# Patient Record
Sex: Female | Born: 1935 | Race: White | Hispanic: No | Smoking: Former smoker
Health system: Southern US, Community
[De-identification: ages and names within clinical notes are randomized; demographics above are authoritative.]

## PROBLEM LIST (undated history)

## (undated) ENCOUNTER — Emergency Department (HOSPITAL_COMMUNITY): Admission: EM | Payer: Self-pay | Source: Home / Self Care

## (undated) DIAGNOSIS — E119 Type 2 diabetes mellitus without complications: Secondary | ICD-10-CM

## (undated) DIAGNOSIS — I10 Essential (primary) hypertension: Secondary | ICD-10-CM

---

## 2003-11-25 ENCOUNTER — Ambulatory Visit (HOSPITAL_COMMUNITY): Admission: RE | Admit: 2003-11-25 | Discharge: 2003-11-25 | Payer: Self-pay | Admitting: Family Medicine

## 2003-12-26 ENCOUNTER — Ambulatory Visit (HOSPITAL_COMMUNITY): Admission: RE | Admit: 2003-12-26 | Discharge: 2003-12-26 | Payer: Self-pay | Admitting: Ophthalmology

## 2005-03-27 IMAGING — CT CT HEAD WO/W CM
3 series · 18 of 30 positions shown, 20 images · IV contrast (omnipaque)
Comparison: none

CLINICAL DATA: Optic neuropathy.
 CT HEAD WITH AND WITHOUT CONTRAST
 Multidetector helical CT imaging head performed before and after 100 cc Omnipaque 300.  No prior studies for comparison. 
 Mild generalized atrophy.  Normal ventricular morphology.  No midline shift or mass effect.  No intracranial hemorrhage, mass, or infarct.  Visualized sinuses clear.  Calvarium unremarkable.  Orbits appear intact and symmetric.  No pathologic enhancement following contrast.  No abnormalities of the optic globes, nerves, or suprasellar regions seen. 
 IMPRESSION
 No acute intracranial abnormalities.

[Series 9479: — · axial · 0.49mm/px · z∈[-643,-533]mm · 8 of 58 slices shown, 10 images (1 of 3)]
[im 7/58  brain]
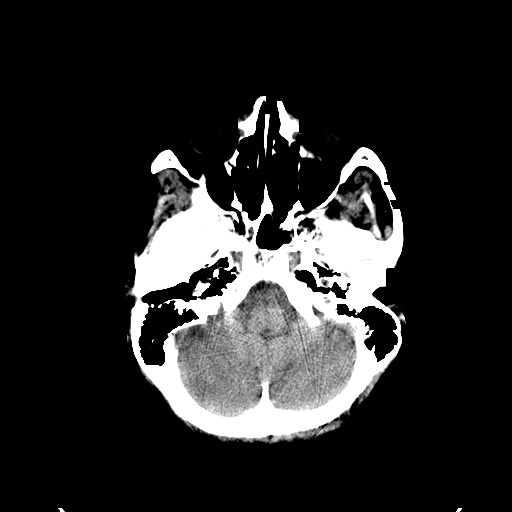
[im 7/58  bone]
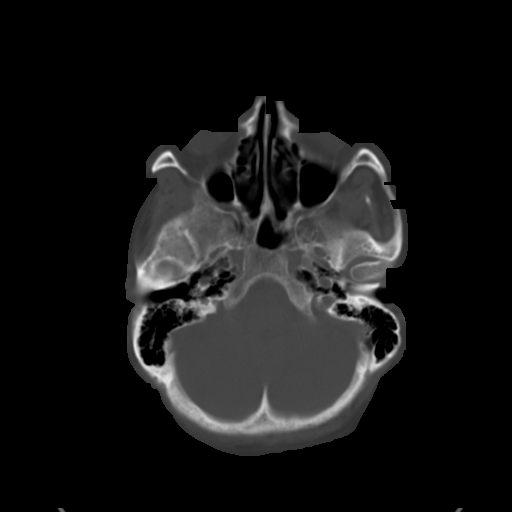
[im 13/58  brain]
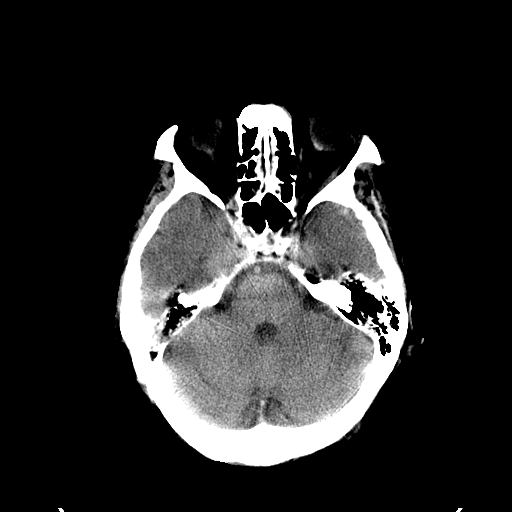
[im 20/58  brain]
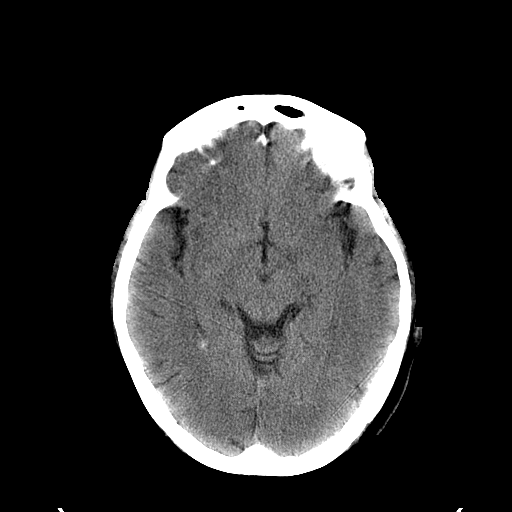
[im 26/58  brain]
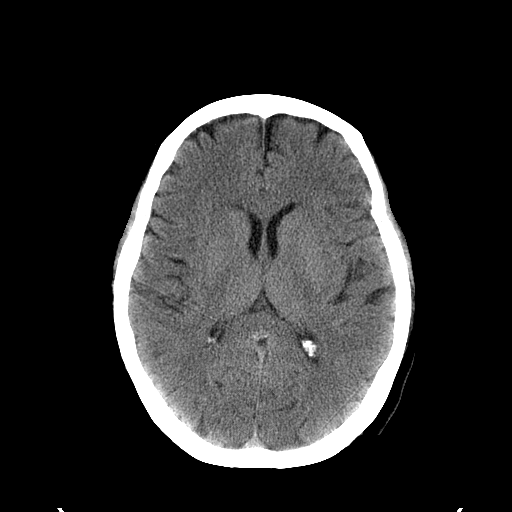
[im 32/58  brain]
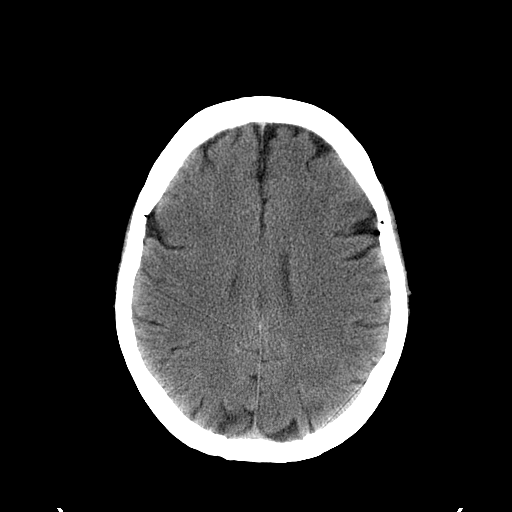
[im 32/58  bone]
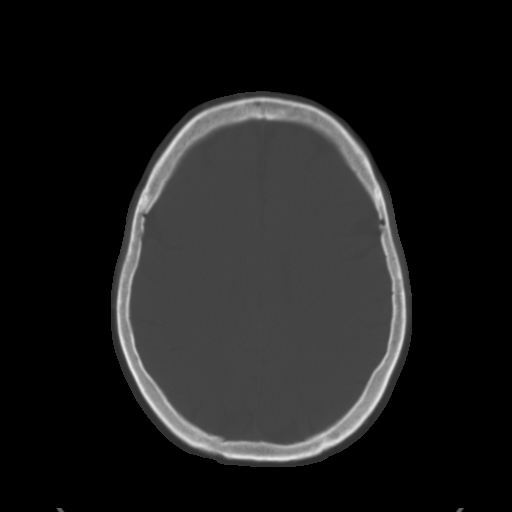
[im 39/58  brain]
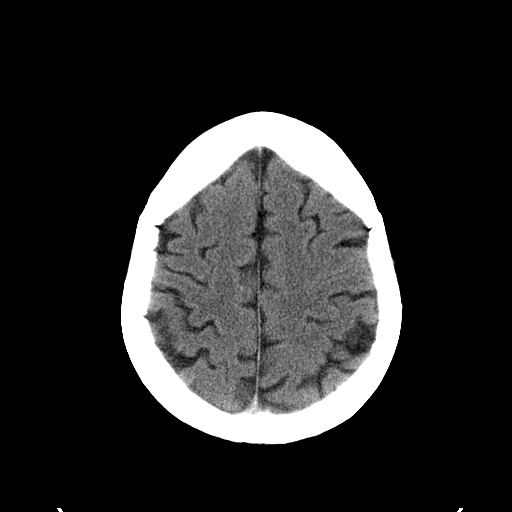
[im 45/58  brain]
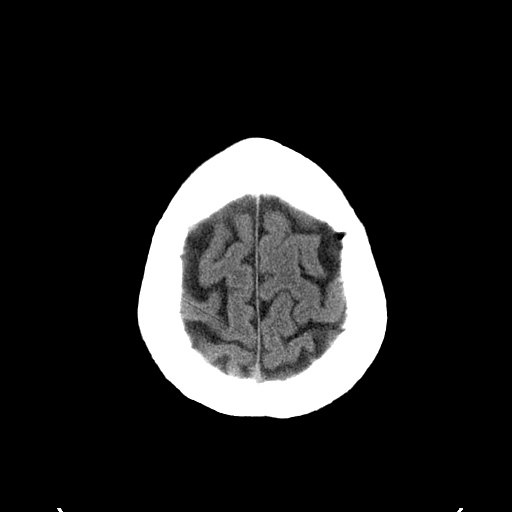
[im 51/58  brain]
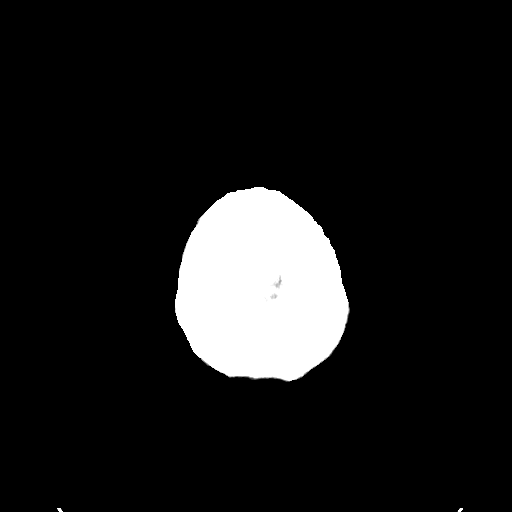

[Series 9480: — · axial · 0.49mm/px · z∈[-643,-533]mm · 8 of 58 slices shown (2 of 3)]
[im 7/58  brain]
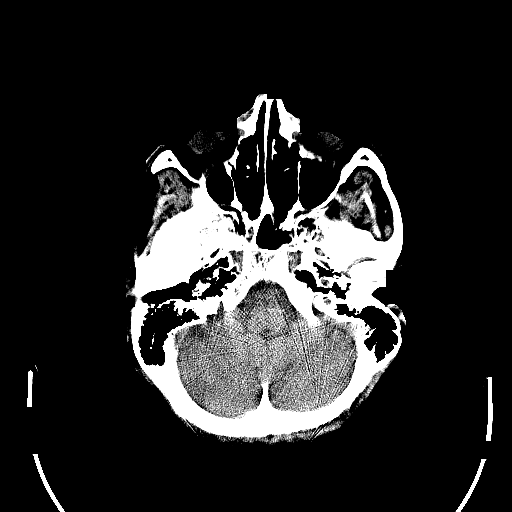
[im 13/58  brain]
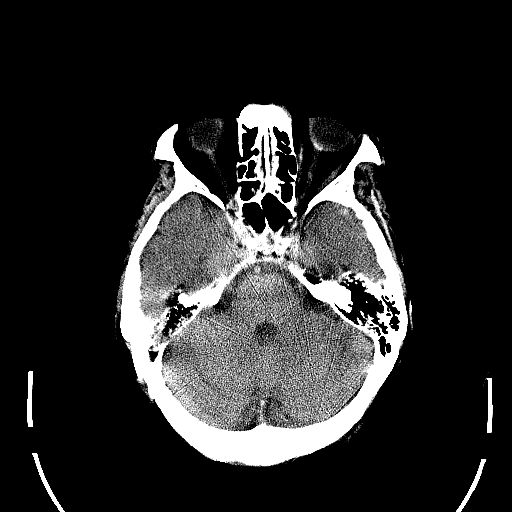
[im 20/58  brain]
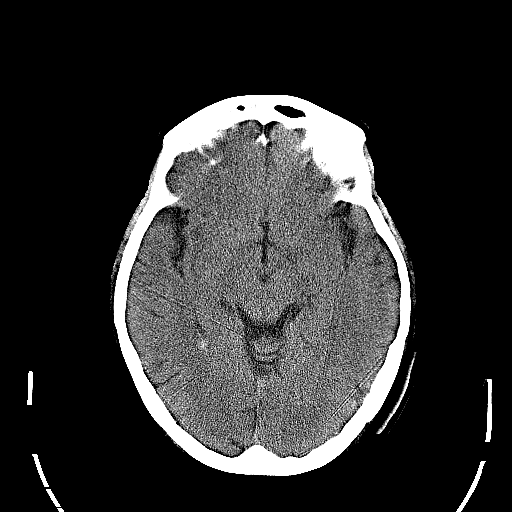
[im 26/58  brain]
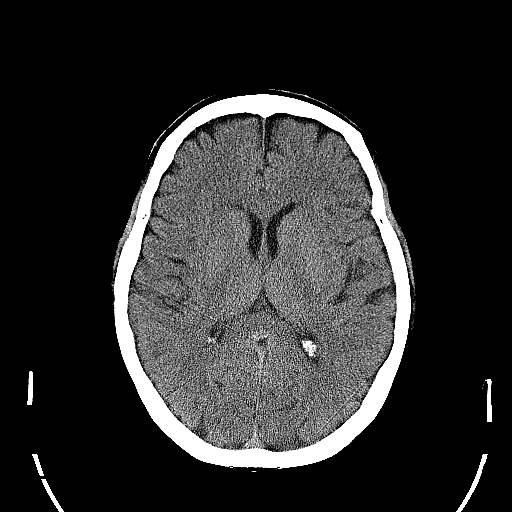
[im 32/58  brain]
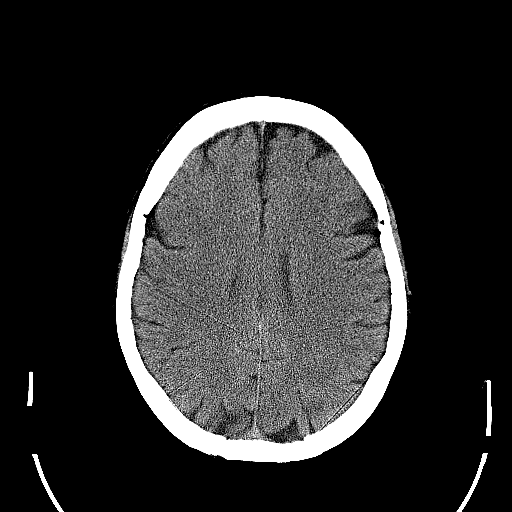
[im 39/58  brain]
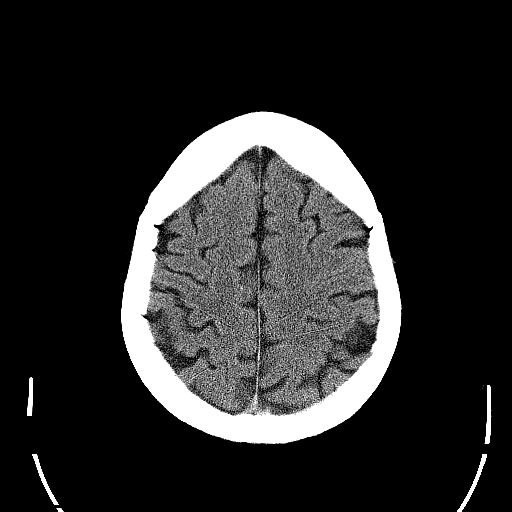
[im 45/58  brain]
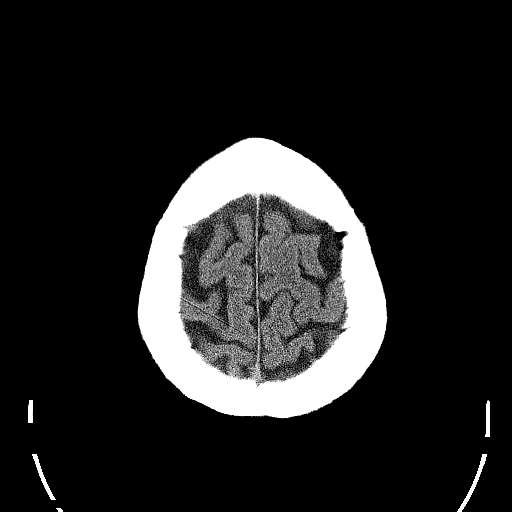
[im 51/58  brain]
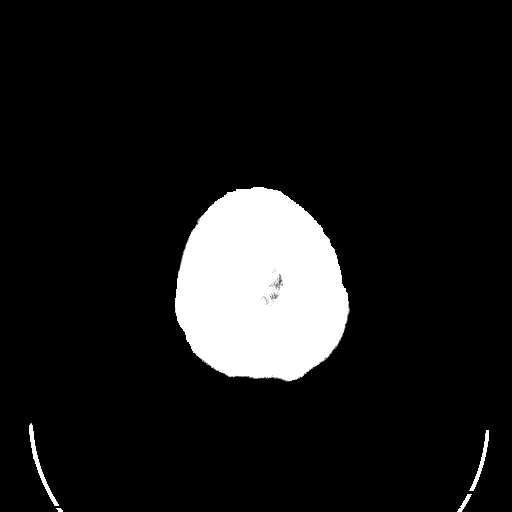

[Series 9481: — · axial · 0.49mm/px · z∈[-643,-628]mm · 2 of 58 slices shown (3 of 3)]
[im 7/58  brain]
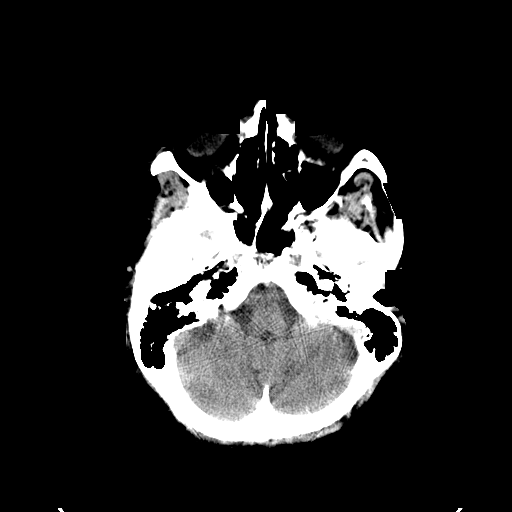
[im 13/58  brain]
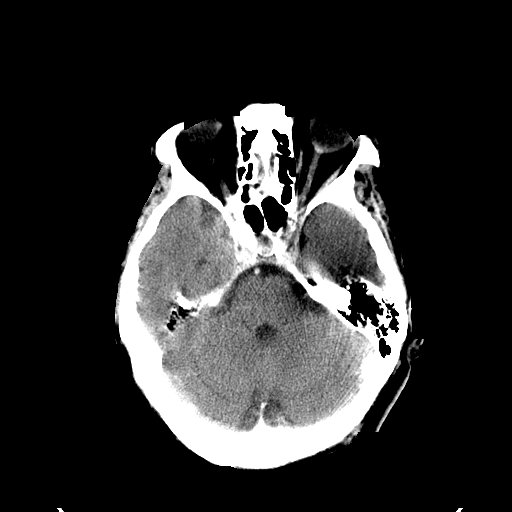

[18 of 30 positions shown; findings below may reference images not displayed]

## 2010-10-17 ENCOUNTER — Encounter: Payer: Self-pay | Admitting: Ophthalmology

## 2015-12-18 ENCOUNTER — Encounter (HOSPITAL_COMMUNITY): Payer: Self-pay | Admitting: Emergency Medicine

## 2015-12-18 ENCOUNTER — Emergency Department (HOSPITAL_COMMUNITY): Payer: Medicare Other

## 2015-12-18 ENCOUNTER — Emergency Department (HOSPITAL_COMMUNITY)
Admission: EM | Admit: 2015-12-18 | Discharge: 2015-12-19 | Disposition: A | Discharge status: Home / Self Care | Payer: Medicare Other | Attending: Emergency Medicine | Admitting: Emergency Medicine

## 2015-12-18 DIAGNOSIS — I10 Essential (primary) hypertension: Secondary | ICD-10-CM | POA: Insufficient documentation

## 2015-12-18 DIAGNOSIS — E119 Type 2 diabetes mellitus without complications: Secondary | ICD-10-CM | POA: Diagnosis not present

## 2015-12-18 DIAGNOSIS — Z87891 Personal history of nicotine dependence: Secondary | ICD-10-CM | POA: Insufficient documentation

## 2015-12-18 DIAGNOSIS — Z7982 Long term (current) use of aspirin: Secondary | ICD-10-CM | POA: Diagnosis not present

## 2015-12-18 DIAGNOSIS — J069 Acute upper respiratory infection, unspecified: Secondary | ICD-10-CM | POA: Insufficient documentation

## 2015-12-18 DIAGNOSIS — Z7984 Long term (current) use of oral hypoglycemic drugs: Secondary | ICD-10-CM | POA: Diagnosis not present

## 2015-12-18 DIAGNOSIS — Z79899 Other long term (current) drug therapy: Secondary | ICD-10-CM | POA: Insufficient documentation

## 2015-12-18 DIAGNOSIS — R05 Cough: Secondary | ICD-10-CM | POA: Diagnosis present

## 2015-12-18 HISTORY — DX: Essential (primary) hypertension: I10

## 2015-12-18 HISTORY — DX: Type 2 diabetes mellitus without complications: E11.9

## 2015-12-18 NOTE — ED Provider Notes (Signed)
CSN: [id number]     Arrival date & time 12/18/15  1959 History  By signing my name below, I, Phillis Haggis, attest that this documentation has been prepared under the direction and in the presence of Devoria Albe, MD at 0002. Electronically Signed: Phillis Haggis, ED Scribe. 12/18/2015. 1:29 AM.    Chief Complaint  Patient presents with  . Cough   The history is provided by the patient. No language interpreter was used.  HPI Comments: Anne Ortega is a 82 y.o. Female with a hx of HTN and DM who presents to the Emergency Department complaining of intermittent mild productive cough with white sputum onset one week ago. Pt has been in contact with grandson who has bronchitis. Pt lives with her daughter. Pt was unable to see her PCP today despite sitting up in the office all morning. She denies hx of smoking, fever, chills, rhinorrhea, sore throat, vomiting, diarrhea, or nausea. She states she does not feel bad. She states the cough is extremely minor and she only coughs "sometimes". She states it does not keep her awake. She states her daughter insisted that she be evaluated.  PCP: Dr. Phillips Odor  Past Medical History  Diagnosis Date  . Hypertension   . Diabetes mellitus without complication (HCC)    History reviewed. No pertinent past surgical history. No family history on file. Social History  Substance Use Topics  . Smoking status: Former Games developer  . Smokeless tobacco: None  . Alcohol Use: No  lives alone Daughter lives next door and spends the night with her  OB History    No data available     Review of Systems  Respiratory: Positive for cough.   All other systems reviewed and are negative.  Allergies  Review of patient's allergies indicates no known allergies.  Home Medications   Prior to Admission medications   Medication Sig Start Date End Date Taking? Authorizing Provider  aspirin EC 81 MG tablet Take 81 mg by mouth daily as needed for mild pain or moderate pain.   Yes  Historical Provider, MD  hydrochlorothiazide (HYDRODIURIL) 25 MG tablet Take 12.5 mg by mouth daily.   Yes Historical Provider, MD  metFORMIN (GLUCOPHAGE) 1000 MG tablet Take 1,000 mg by mouth 2 (two) times daily with a meal.   Yes Historical Provider, MD  metoprolol succinate (TOPROL-XL) 25 MG 24 hr tablet Take 25 mg by mouth daily.   Yes Historical Provider, MD  trandolapril (MAVIK) 2 MG tablet Take 2 mg by mouth every evening.    Yes Historical Provider, MD   BP 138/76 mmHg  Pulse 62  Temp(Src) 99.2 F (37.3 C) (Oral)  Resp 16  Ht  (1.753 m)  Wt 160 lb (72.576 kg)  BMI 23.62 kg/m2  SpO2 100%  Vital signs normal   Physical Exam  Constitutional: She is oriented to person, place, and time. She appears well-developed and well-nourished.  Non-toxic appearance. She does not appear ill. No distress.  HENT:  Head: Normocephalic and atraumatic.  Right Ear: External ear normal.  Left Ear: External ear normal.  Nose: Nose normal. No mucosal edema or rhinorrhea.  Mouth/Throat: Oropharynx is clear and moist and mucous membranes are normal. No dental abscesses or uvula swelling.  Eyes: Conjunctivae and EOM are normal. Pupils are equal, round, and reactive to light.  Neck: Normal range of motion and full passive range of motion without pain. Neck supple.  Cardiovascular: Normal rate, regular rhythm and normal heart sounds.  Exam reveals no  gallop and no friction rub.   No murmur heard. Pulmonary/Chest: Effort normal and breath sounds normal. No respiratory distress. She has no wheezes. She has no rhonchi. She has no rales. She exhibits no tenderness and no crepitus.  No coughing during her interview  Abdominal: Soft. Normal appearance and bowel sounds are normal. She exhibits no distension. There is no tenderness. There is no rebound and no guarding.  Musculoskeletal: Normal range of motion. She exhibits no edema or tenderness.  Moves all extremities well.   Neurological: She is alert and  oriented to person, place, and time. She has normal strength. No cranial nerve deficit.  Skin: Skin is warm, dry and intact. No rash noted. No erythema. No pallor.  Psychiatric: She has a normal mood and affect. Her speech is normal and behavior is normal. Her mood appears not anxious.  Nursing note and vitals reviewed.   ED Course  Procedures (including critical care time) DIAGNOSTIC STUDIES: Oxygen Saturation is 100% on RA, normal by my interpretation.    COORDINATION OF CARE: 12:07 AM-Pt given her CXR results.  We discussed she probably has a viral URI. Her symptoms are not significant even according to the patient. It seems she is basically here to aleave some anxiety of some family members. She was advised to be rechecked if she gets worse such as difficulty breathing, fever, or if the cough is getting debilitating where it's interfering with her sleep or daily activities.   Imaging Review Dg Chest 2 View  12/18/2015  CLINICAL DATA:  Chest congestion, cough, weakness, shortness of Breath EXAM: CHEST  2 VIEW COMPARISON:  None. FINDINGS: Cardiomediastinal silhouette is unremarkable. No acute infiltrate or pleural effusion. No pulmonary edema. Osteopenia and mild degenerative changes thoracic spine. IMPRESSION: No active cardiopulmonary disease. Electronically Signed   By: Natasha MeadLiviu  Pop M.D.   On: 12/18/2015 21:08   I have personally reviewed and evaluated these images and lab results as part of my medical decision-making.    MDM   Final diagnoses:  URI (upper respiratory infection)   Plan discharge  Devoria AlbeIva Danzig Macgregor, MD, FACEP   I personally performed the services described in this documentation, which was scribed in my presence. The recorded information has been reviewed and considered.  Devoria AlbeIva Treylan Mcclintock, MD, Concha PyoFACEP    Kristalyn Bergstresser, MD 12/19/15 0130

## 2015-12-18 NOTE — ED Notes (Signed)
Pt c/o cough that started this week. Pt states she has been around grandson who has bronchitis.

## 2015-12-19 NOTE — Discharge Instructions (Signed)
Drink plenty of fluids. Recheck if you cough gets worse, you have difficulty sleeping because of coughing, you get a fever, you get short of breath or you feel bad.

## 2016-11-18 ENCOUNTER — Other Ambulatory Visit: Payer: Self-pay | Admitting: Specialist

## 2016-11-18 DIAGNOSIS — R918 Other nonspecific abnormal finding of lung field: Secondary | ICD-10-CM

## 2016-11-24 ENCOUNTER — Ambulatory Visit: Payer: Medicare Other

## 2017-03-19 IMAGING — DX DG CHEST 2V
2 series · 2 of 2 positions shown · non-contrast
Comparison: None.

CLINICAL DATA: Chest congestion, cough, weakness, shortness of
Breath

EXAM:
CHEST  2 VIEW

[chest pa]
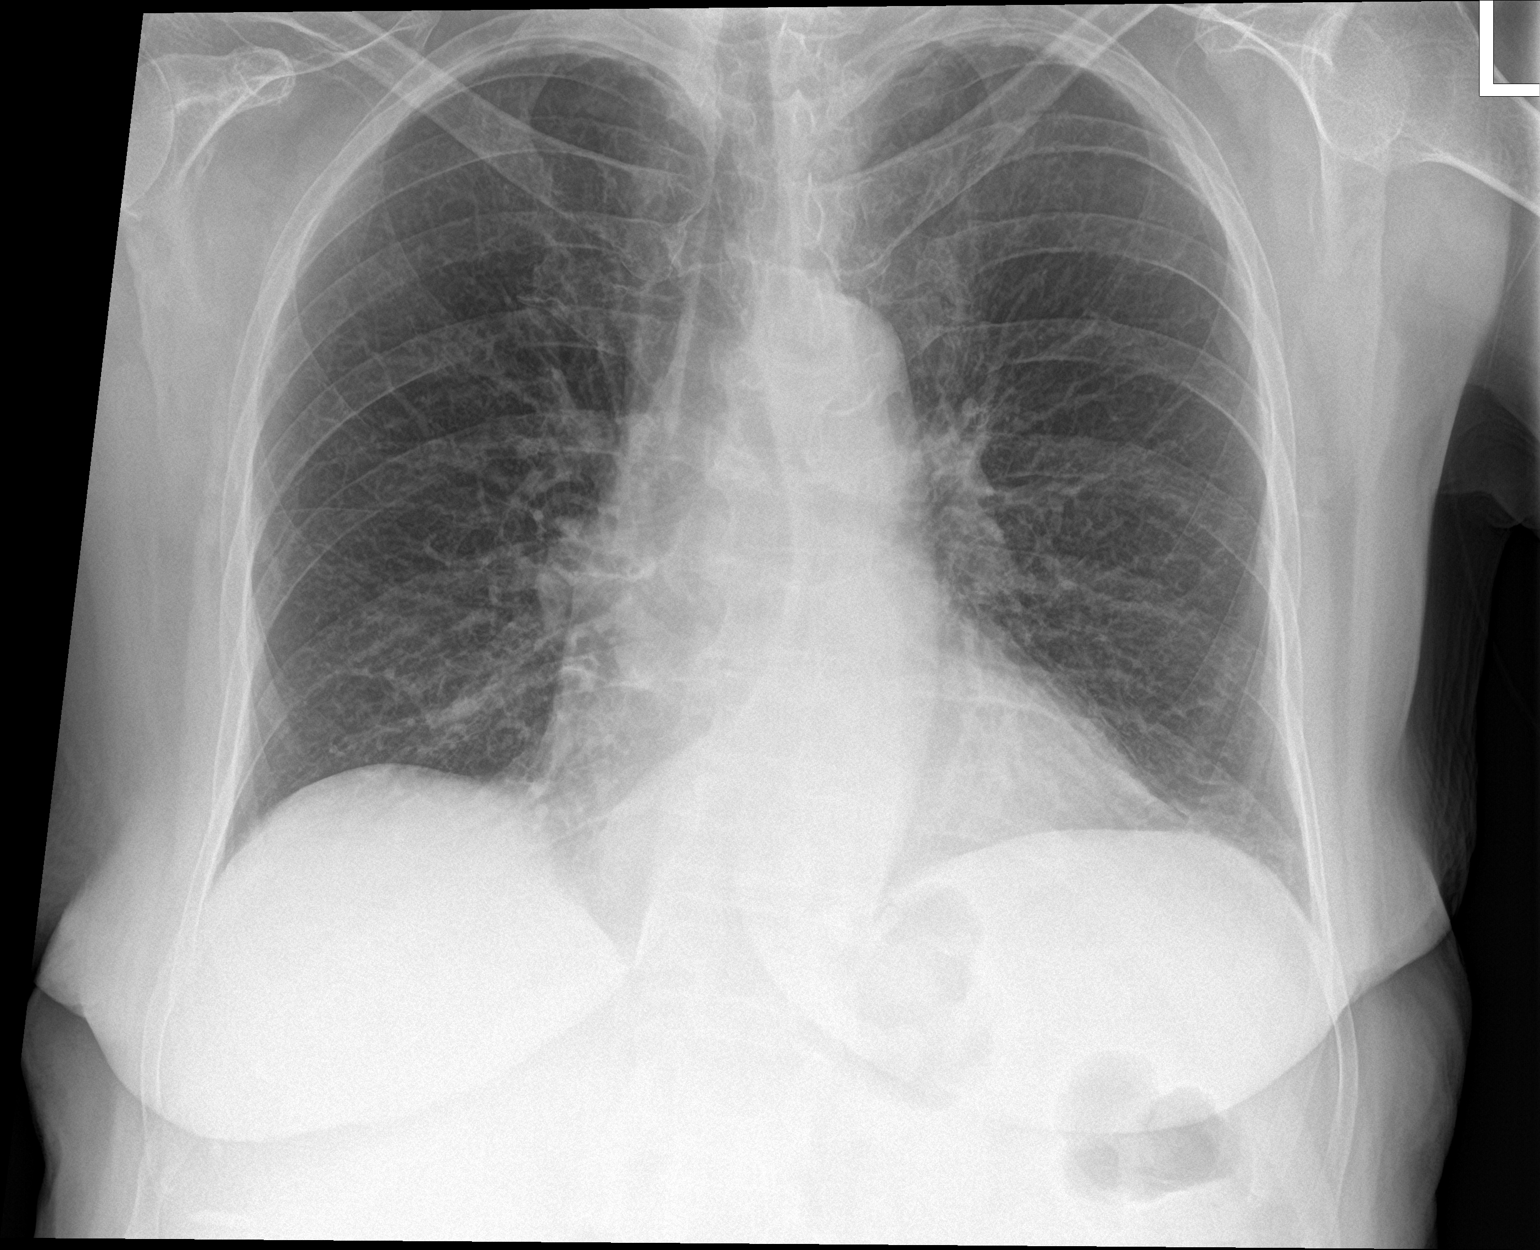

[chest lat]
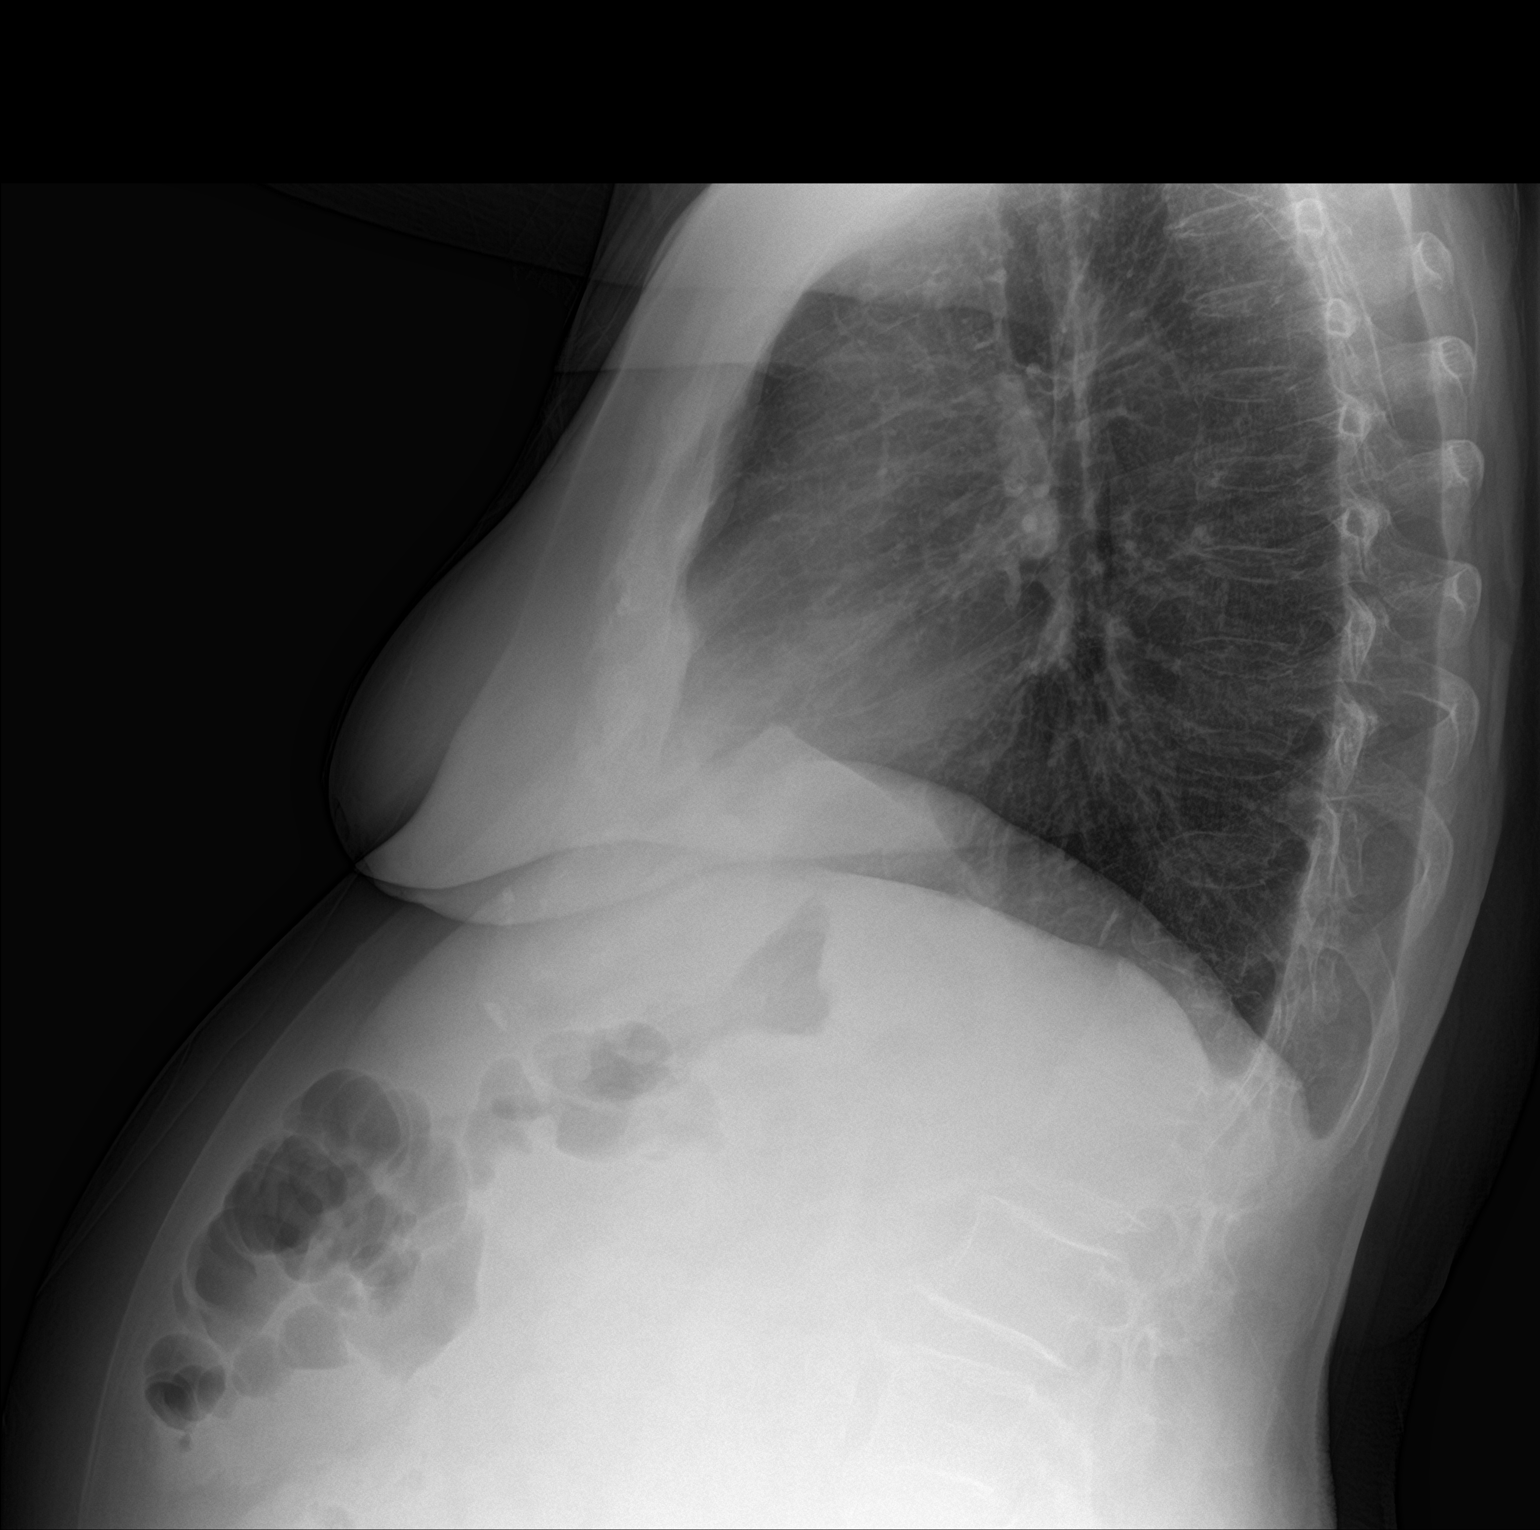

[2 of 2 positions shown; findings below may reference images not displayed]

FINDINGS: Cardiomediastinal silhouette is unremarkable. No acute infiltrate or
pleural effusion. No pulmonary edema. Osteopenia and mild
degenerative changes thoracic spine.
IMPRESSION: No active cardiopulmonary disease.
